# Patient Record
Sex: Female | Born: 1990 | Hispanic: No | Marital: Single | Smoking: Never smoker
Health system: Southern US, Community
[De-identification: ages and names within clinical notes are randomized; demographics above are authoritative.]

---

## 2014-08-26 ENCOUNTER — Ambulatory Visit: Payer: Self-pay

## 2016-07-16 ENCOUNTER — Encounter: Payer: Self-pay | Admitting: Physician Assistant

## 2016-07-16 ENCOUNTER — Ambulatory Visit (INDEPENDENT_AMBULATORY_CARE_PROVIDER_SITE_OTHER): Payer: BLUE CROSS/BLUE SHIELD | Admitting: Physician Assistant

## 2016-07-16 VITALS — BP 102/68 | HR 95 | Temp 98.0°F | Resp 18 | Ht 64.96 in | Wt 135.0 lb

## 2016-07-16 DIAGNOSIS — M461 Sacroiliitis, not elsewhere classified: Secondary | ICD-10-CM

## 2016-07-16 DIAGNOSIS — R0989 Other specified symptoms and signs involving the circulatory and respiratory systems: Secondary | ICD-10-CM

## 2016-07-16 DIAGNOSIS — F458 Other somatoform disorders: Secondary | ICD-10-CM | POA: Diagnosis not present

## 2016-07-16 DIAGNOSIS — R198 Other specified symptoms and signs involving the digestive system and abdomen: Secondary | ICD-10-CM

## 2016-07-16 MED ORDER — CYCLOBENZAPRINE HCL 10 MG PO TABS: 10.0000 mg | ORAL_TABLET | Freq: Three times a day (TID) | ORAL | 0 refills | Status: DC | PRN

## 2016-07-16 MED ORDER — OMEPRAZOLE 20 MG PO CPDR: 20.0000 mg | DELAYED_RELEASE_CAPSULE | Freq: Every day | ORAL | 3 refills | Status: DC

## 2016-07-16 MED ORDER — MELOXICAM 15 MG PO TABS: 15.0000 mg | ORAL_TABLET | Freq: Every day | ORAL | 0 refills | Status: DC

## 2016-07-16 NOTE — Patient Instructions (Addendum)
Ice the back three times per day for 15 minutes Three sets of stretching.  Pick three pictures, and perform them.  After you are done, you will ice the back.   Do not take naproxen or ibuprofen.  You can take tylenol Please be careful with the flexeril.    Back Exercises If you have pain in your back, do these exercises 2-3 times each day or as told by your doctor. When the pain goes away, do the exercises once each day, but repeat the steps more times for each exercise (do more repetitions). If you do not have pain in your back, do these exercises once each day or as told by your doctor. Exercises Single Knee to Chest  Do these steps 3-5 times in a row for each leg: 1. Lie on your back on a firm bed or the floor with your legs stretched out. 2. Bring one knee to your chest. 3. Hold your knee to your chest by grabbing your knee or thigh. 4. Pull on your knee until you feel a gentle stretch in your lower back. 5. Keep doing the stretch for 10-30 seconds. 6. Slowly let go of your leg and straighten it.  Pelvic Tilt  Do these steps 5-10 times in a row: 1. Lie on your back on a firm bed or the floor with your legs stretched out. 2. Bend your knees so they point up to the ceiling. Your feet should be flat on the floor. 3. Tighten your lower belly (abdomen) muscles to press your lower back against the floor. This will make your tailbone point up to the ceiling instead of pointing down to your feet or the floor. 4. Stay in this position for 5-10 seconds while you gently tighten your muscles and breathe evenly.  Cat-Cow  Do these steps until your lower back bends more easily: 1. Get on your hands and knees on a firm surface. Keep your hands under your shoulders, and keep your knees under your hips. You may put padding under your knees. 2. Let your head hang down, and make your tailbone point down to the floor so your lower back is round like the back of a cat. 3. Stay in this position for 5  seconds. 4. Slowly lift your head and make your tailbone point up to the ceiling so your back hangs low (sags) like the back of a cow. 5. Stay in this position for 5 seconds.  Press-Ups  Do these steps 5-10 times in a row: 1. Lie on your belly (face-down) on the floor. 2. Place your hands near your head, about shoulder-width apart. 3. While you keep your back relaxed and keep your hips on the floor, slowly straighten your arms to raise the top half of your body and lift your shoulders. Do not use your back muscles. To make yourself more comfortable, you may change where you place your hands. 4. Stay in this position for 5 seconds. 5. Slowly return to lying flat on the floor.  Bridges  Do these steps 10 times in a row: 1. Lie on your back on a firm surface. 2. Bend your knees so they point up to the ceiling. Your feet should be flat on the floor. 3. Tighten your butt muscles and lift your butt off of the floor until your waist is almost as high as your knees. If you do not feel the muscles working in your butt and the back of your thighs, slide your feet 1-2 inches  farther away from your butt. 4. Stay in this position for 3-5 seconds. 5. Slowly lower your butt to the floor, and let your butt muscles relax.  If this exercise is too easy, try doing it with your arms crossed over your chest. Belly Crunches  Do these steps 5-10 times in a row: 1. Lie on your back on a firm bed or the floor with your legs stretched out. 2. Bend your knees so they point up to the ceiling. Your feet should be flat on the floor. 3. Cross your arms over your chest. 4. Tip your chin a little bit toward your chest but do not bend your neck. 5. Tighten your belly muscles and slowly raise your chest just enough to lift your shoulder blades a tiny bit off of the floor. 6. Slowly lower your chest and your head to the floor.  Back Lifts Do these steps 5-10 times in a row: 1. Lie on your belly (face-down) with your  arms at your sides, and rest your forehead on the floor. 2. Tighten the muscles in your legs and your butt. 3. Slowly lift your chest off of the floor while you keep your hips on the floor. Keep the back of your head in line with the curve in your back. Look at the floor while you do this. 4. Stay in this position for 3-5 seconds. 5. Slowly lower your chest and your face to the floor.  Contact a doctor if:  Your back pain gets a lot worse when you do an exercise.  Your back pain does not lessen 2 hours after you exercise. If you have any of these problems, stop doing the exercises. Do not do them again unless your doctor says it is okay. Get help right away if:  You have sudden, very bad back pain. If this happens, stop doing the exercises. Do not do them again unless your doctor says it is okay. This information is not intended to replace advice given to you by your health care provider. Make sure you discuss any questions you have with your health care provider. Document Released: 03/03/2010 Document Revised: 07/07/2015 Document Reviewed: 03/25/2014 Elsevier Interactive Patient Education  2018 [employer].  Sacroiliac Joint Dysfunction Sacroiliac joint dysfunction is a condition that causes inflammation on one or both sides of the sacroiliac (SI) joint. The SI joint connects the lower part of the spine (sacrum) with the two upper portions of the pelvis (ilium). This condition causes deep aching or burning pain in the low back. In some cases, the pain may also spread into one or both buttocks or hips or spread down the legs.  What are the signs or symptoms? Symptoms of this condition include:  Aching or burning pain in the lower back. The pain may also spread to other areas, such as: ? Buttocks. ? Groin. ? Thighs and legs.  Muscle spasms in or around the painful areas.  Increased pain when standing, walking, running, stair climbing, bending, or lifting.  How is this  diagnosed? Your health care provider will do a physical exam and take your medical history. During the exam, the health care provider may move one or both of your legs to different positions to check for pain. Various tests may be done to help verify the diagnosis, including:  Imaging tests to look for other causes of pain. These may include: ? MRI. ? CT scan. ? Bone scan.  Diagnostic injection. A numbing medicine is injected into the SI joint  using a needle. If the pain is temporarily improved or stopped after the injection, this can indicate that SI joint dysfunction is the problem.  How is this treated? Treatment may vary depending on the cause and severity of your condition. Treatment options may include:  Applying ice or heat to the lower back area. This can help to reduce pain and muscle spasms.  Medicines to relieve pain or inflammation or to relax the muscles.  Wearing a back brace (sacroiliac brace) to help support the joint while your back is healing.  Physical therapy to increase muscle strength around the joint and flexibility at the joint. This may also involve learning proper body positions and ways of moving to relieve stress on the joint.  Direct manipulation of the SI joint.  Injections of steroid medicine into the joint in order to reduce pain and swelling.  Radiofrequency ablation to burn away nerves that are carrying pain messages from the joint.  Use of a device that provides electrical stimulation in order to reduce pain at the joint.  Surgery to put in screws and plates that limit or prevent joint motion. This is rare.  Follow these instructions at home:  Rest as needed. Limit your activities as directed by your health care provider.  Take medicines only as directed by your health care provider.  If directed, apply ice to the affected area: ? Put ice in a plastic bag. ? Place a towel between your skin and the bag. ? Leave the ice on for 20 minutes, 2-3  times per day.  Use a heating pad or a moist heat pack as directed by your health care provider.  Exercise as directed by your health care provider or physical therapist.  Keep all follow-up visits as directed by your health care provider. This is important. Contact a health care provider if:  Your pain is not controlled with medicine.  You have a fever.  You have increasingly severe pain. Get help right away if:  You have weakness, numbness, or tingling in your legs or feet.  You lose control of your bladder or bowel. This information is not intended to replace advice given to you by your health care provider. Make sure you discuss any questions you have with your health care provider. Document Released: 04/27/2008 Document Revised: 07/07/2015 Document Reviewed: 10/06/2013 Elsevier Interactive Patient Education  2018 [employer].   Food Choices for Gastroesophageal Reflux Disease, Adult When you have gastroesophageal reflux disease (GERD), the foods you eat and your eating habits are very important. Choosing the right foods can help ease your discomfort. What guidelines do I need to follow?  Choose fruits, vegetables, whole grains, and low-fat dairy products.  Choose low-fat meat, fish, and poultry.  Limit fats such as oils, salad dressings, butter, nuts, and avocado.  Keep a food diary. This helps you identify foods that cause symptoms.  Avoid foods that cause symptoms. These may be different for everyone.  Eat small meals often instead of 3 large meals a day.  Eat your meals slowly, in a place where you are relaxed.  Limit fried foods.  Cook foods using methods other than frying.  Avoid drinking alcohol.  Avoid drinking large amounts of liquids with your meals.  Avoid bending over or lying down until 2-3 hours after eating. What foods are not recommended? These are some foods and drinks that may make your symptoms worse: Vegetables Tomatoes. Tomato juice.  Tomato and spaghetti sauce. Chili peppers. Onion and garlic. Horseradish. Fruits  Oranges, grapefruit, and lemon (fruit and juice). Meats High-fat meats, fish, and poultry. This includes hot dogs, ribs, ham, sausage, salami, and bacon. Dairy Whole milk and chocolate milk. Sour cream. Cream. Butter. Ice cream. Cream cheese. Drinks Coffee and tea. Bubbly (carbonated) drinks or energy drinks. Condiments Hot sauce. Barbecue sauce. Sweets/Desserts Chocolate and cocoa. Donuts. Peppermint and spearmint. Fats and Oils High-fat foods. This includes [place] fries and potato chips. Other Vinegar. Strong spices. This includes black pepper, white pepper, red pepper, cayenne, curry powder, cloves, ginger, and chili powder. The items listed above may not be a complete list of foods and drinks to avoid. Contact your dietitian for more information. This information is not intended to replace advice given to you by your health care provider. Make sure you discuss any questions you have with your health care provider. Document Released: 07/31/2011 Document Revised: 07/07/2015 Document Reviewed: 12/03/2012 Elsevier Interactive Patient Education  2017 [employer].    IF you received an x-ray today, you will receive an invoice from [facility]. Please contact [facility] at [phone] with questions or concerns regarding your invoice.   IF you received labwork today, you will receive an invoice from [facility]. Please contact [facility] at [phone] with questions or concerns regarding your invoice.   Our billing staff will not be able to assist you with questions regarding bills from these companies.  You will be contacted with the lab results as soon as they are available. The fastest way to get your results is to activate your My Chart account. Instructions are located on the last page of this paperwork. If you have not heard from [place] regarding the results in 2 weeks, please contact this  office.

## 2016-07-16 NOTE — Progress Notes (Signed)
PRIMARY CARE AT [facility] [address], [place] [address] [phone]  Date:  07/16/2016   Name:  Linda May   DOB:  [birth year]   MRN:  [id number]  PCP:  Patient, No Pcp Per    History of Present Illness:  Linda May is a 26 y.o. female patient who presents to PCP with  Chief Complaint  Patient presents with  . Establish Care    pt concerned about lower back pain and some blood in her mucus x 2 months.      Pressure along the right side of leg.  It radiates down her right leg, with numbness.  Right pain makes it feel unstable.  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   No swelling.  She recalls that she may have fallen along that side of her back, down the stairs.   Couple months of blood.  She will see red streaking after she tries to cough it up.  No hx of coughing or sore throat.  No fever.  No heavy bleeding of gums.    There are no active problems to display for this patient.   History reviewed. No pertinent past medical history.  History reviewed. No pertinent surgical history.  Social History  Substance Use Topics  . Smoking status: Never Smoker  . Smokeless tobacco: Never Used  . Alcohol use No    Family History  Problem Relation Age of Onset  . Diabetes Mother   . Cancer Father   . Diabetes Maternal Grandmother   . Cancer Maternal Grandfather     Not on File  Medication list has been reviewed and updated.  No current outpatient prescriptions on file prior to visit.   No current facility-administered medications on file prior to visit.     ROS ROS otherwise unremarkable unless listed above.  Physical Examination: BP 102/68   Pulse 95   Temp 98 F (36.7 C) (Oral)   Resp 18   Ht 5' 4.96" (1.65 m)   Wt 135 lb (61.2 kg)   LMP 07/02/2016 (Approximate)   SpO2 99%   BMI 22.49 kg/m  Ideal Body Weight: Weight in (lb) to have BMI = 25: 149.7  Physical Exam  Constitutional: She is  oriented to person, place, and time. She appears well-developed and well-nourished. No distress.  HENT:  Head: Normocephalic and atraumatic.  Right Ear: External ear normal.  Left Ear: External ear normal.  Eyes: Conjunctivae and EOM are normal. Pupils are equal, round, and reactive to light.  Cardiovascular: Normal rate.   Pulmonary/Chest: Effort normal. No respiratory distress.  Musculoskeletal:       Lumbar back: She exhibits bony tenderness (si joint (right side)).  No swelling Positive straight leg raise test. No tenderness along the piriformis. Nl forward flexion, lateral deviation rom. Normal lower extremity strength.   Normal patellar reflexes.  Neurological: She is alert and oriented to person, place, and time.  Skin: She is not diaphoretic.  Psychiatric: She has a normal mood and affect. Her behavior is normal.     Assessment and Plan: Linda May is a 26 y.o. female who is here today for cc of bleeding gums Advised anti-inflammatory and msk relaxer.  Precautions discussed.  Icing 3 times per day for 15 minutes and stretches discussed and demonstrated. Given omeprazole for globus sensation, along with gerd diet.  Follow up in 2 weeks if symptoms do improve of the  back pain or globus sensation. Sacroiliitis (HCC) - Plan: meloxicam (MOBIC) 15 MG tablet, cyclobenzaprine (FLEXERIL) 10 MG tablet  Globus sensation - Plan: omeprazole (PRILOSEC) 20 MG capsule  Linda PlattStephanie Jodel Mayhall, PA-C [facility] 6/6/20187:15 PM 25 minutes of direct care given at this visit.

## 2016-09-19 ENCOUNTER — Encounter: Payer: Self-pay | Admitting: Family Medicine

## 2016-09-19 ENCOUNTER — Ambulatory Visit (INDEPENDENT_AMBULATORY_CARE_PROVIDER_SITE_OTHER): Payer: BLUE CROSS/BLUE SHIELD | Admitting: Family Medicine

## 2016-09-19 VITALS — BP 117/74 | HR 78 | Temp 99.0°F | Resp 17 | Ht 65.5 in | Wt 135.0 lb

## 2016-09-19 DIAGNOSIS — M545 Low back pain, unspecified: Secondary | ICD-10-CM

## 2016-09-19 DIAGNOSIS — M533 Sacrococcygeal disorders, not elsewhere classified: Secondary | ICD-10-CM | POA: Diagnosis not present

## 2016-09-19 DIAGNOSIS — M461 Sacroiliitis, not elsewhere classified: Secondary | ICD-10-CM

## 2016-09-19 DIAGNOSIS — S76011A Strain of muscle, fascia and tendon of right hip, initial encounter: Secondary | ICD-10-CM | POA: Diagnosis not present

## 2016-09-19 DIAGNOSIS — G8929 Other chronic pain: Secondary | ICD-10-CM | POA: Diagnosis not present

## 2016-09-19 LAB — POC MICROSCOPIC URINALYSIS (UMFC): Mucus: ABSENT

## 2016-09-19 LAB — POCT URINALYSIS DIP (MANUAL ENTRY)
Bilirubin, UA: NEGATIVE
Glucose, UA: NEGATIVE mg/dL
Ketones, POC UA: NEGATIVE mg/dL
Leukocytes, UA: NEGATIVE
Nitrite, UA: NEGATIVE
Protein Ur, POC: NEGATIVE mg/dL
Spec Grav, UA: 1.015
Urobilinogen, UA: 0.2 U/dL
pH, UA: 6

## 2016-09-19 MED ORDER — DICLOFENAC SODIUM 75 MG PO TBEC: 75.0000 mg | DELAYED_RELEASE_TABLET | Freq: Two times a day (BID) | ORAL | 1 refills | Status: AC

## 2016-09-19 MED ORDER — POLYETHYLENE GLYCOL 3350 17 GM/SCOOP PO POWD: 17.0000 g | Freq: Every day | ORAL | 11 refills | Status: AC

## 2016-09-19 NOTE — Patient Instructions (Signed)
     IF you received an x-ray today, you will receive an invoice from [facility]. Please contact [facility] at [phone] with questions or concerns regarding your invoice.   IF you received labwork today, you will receive an invoice from [facility]. Please contact [facility] at [phone] with questions or concerns regarding your invoice.   Our billing staff will not be able to assist you with questions regarding bills from these companies.  You will be contacted with the lab results as soon as they are available. The fastest way to get your results is to activate your My Chart account. Instructions are located on the last page of this paperwork. If you have not heard from us regarding the results in 2 weeks, please contact this office.     

## 2016-09-19 NOTE — Progress Notes (Addendum)
Subjective:  By signing my name below, I, Linda May, attest that this documentation has been prepared under the direction and in the presence of Clorene Nerio, MD Electronically Signed: Charline BillsEssence May, ED Scribe 09/19/2016 at 1:51 PM.   Patient ID: Linda ClienNorberto SorensontHiba Ress Mohamed Taha May, female    DOB: [birth year], 26 y.o.   MRN: [id number]  Chief Complaint  Patient presents with  . Back Pain   HPI Linda May is a 26 y.o. female who [facility] complaining of  Pt presents with gradually worsening right low back pain in the upper sacrum/midline area. States back pain seemed to be improving until she slipped in the bathroom yesterday after showering and noticed shaking in her legs afterwards. She reports some gradually improving weakness in LE, R worse than L, worse with standing. Pt also reports some mild abdominal pain and constipation which she attributes to Mobic and Flexeril. She denies numbness/tingling in her LE, diarrhea, bladder/bowel incontinence, dysuria, vaginal discharge, vaginal itching, malodourous urine, heartburn, indigestion. Pt denies chance of pregnancy; LNMP ended 2 days ago.   No past medical history on file. Current Outpatient Prescriptions on File Prior to Visit  Medication Sig Dispense Refill  . cyclobenzaprine (FLEXERIL) 10 MG tablet Take 1 tablet (10 mg total) by mouth 3 (three) times daily as needed for muscle spasms. (Patient not taking: Reported on 09/19/2016) 30 tablet 0  . meloxicam (MOBIC) 15 MG tablet Take 1 tablet (15 mg total) by mouth daily. (Patient not taking: Reported on 09/19/2016) 30 tablet 0  . omeprazole (PRILOSEC) 20 MG capsule Take 1 capsule (20 mg total) by mouth daily. (Patient not taking: Reported on 09/19/2016) 30 capsule 3   No current facility-administered medications on file prior to visit.    Not on File   No past surgical history on file. Family History  Problem Relation Age of Onset  . Diabetes Mother   .  Cancer Father   . Diabetes Maternal Grandmother   . Cancer Maternal Grandfather    Social History   Social History  . Marital status: Single    Spouse name: N/A  . Number of children: N/A  . Years of education: N/A   Social History Main Topics  . Smoking status: Never Smoker  . Smokeless tobacco: Never Used  . Alcohol use No  . Drug use: No  . Sexual activity: No   Other Topics Concern  . None   Social History Narrative  . None   [facility] 09/19/2016 07/16/2016  Decreased Interest 0 0  Down, Depressed, Hopeless 0 0  PHQ - 2 Score 0 0    Review of Systems  Musculoskeletal: Positive for back pain.      Objective:   Physical Exam  Constitutional: She is oriented to person, place, and time. She appears well-developed and well-nourished. No distress.  HENT:  Head: Normocephalic and atraumatic.  Right Ear: External ear normal.  Eyes: Conjunctivae are normal. No scleral icterus.  Pulmonary/Chest: Effort normal.  Neurological: She is alert and oriented to person, place, and time.  Skin: Skin is warm and dry. She is not diaphoretic. No erythema.  Psychiatric: She has a normal mood and affect. Her behavior is normal.    Blood pressure 117/74, pulse 78, temperature 99 F (37.2 C), temperature source Oral, resp. rate 17, height 5' 5.5" (1.664 m), weight 135 lb (61.2 kg), last menstrual period 09/18/2016, SpO2 98 %. 3+ patellar and achilles DTRs.  5/5 LE strength bilateral except R hip flexor which is 4+.  Negative straight leg raise bilaterally. Tenderness to palpation over the R low lumbar sacral paraspinal muscles. Severe pain over the R SI joint. Mild pain over bilateral trochanter. Pain with R internal hip rotation but hip ROM normal.  Normal bowel sounds. Mild tenderness to palpation over the R adnexa.      Results for orders placed or performed in visit on 09/19/16  POCT urinalysis dipstick  Result Value Ref Range   Color, UA yellow yellow   Clarity,  UA clear clear   Glucose, UA negative negative mg/dL   Bilirubin, UA negative negative   Ketones, POC UA negative negative mg/dL   Spec Grav, UA 1.610 9.604 - 1.025   Blood, UA moderate (A) negative   pH, UA 6.0 5.0 - 8.0   Protein Ur, POC negative negative mg/dL   Urobilinogen, UA 0.2 0.2 or 1.0 E.U./dL   Nitrite, UA Negative Negative   Leukocytes, UA Negative Negative  POCT Microscopic Urinalysis ([facility])  Result Value Ref Range   WBC,UR,HPF,POC None None WBC/hpf   RBC,UR,HPF,POC Few (A) None RBC/hpf   Bacteria Few (A) None, Too numerous to count   Mucus Absent Absent   Epithelial Cells, UR Per Microscopy Few (A) None, Too numerous to count cells/hpf    Assessment & Plan:   1. Sacroiliitis (HCC)   2. Acute bilateral low back pain without sciatica   3. Chronic right SI joint pain   4. Strain of flexor muscle of right hip, initial encounter    Our xray machine is currently not working - pt agrees to [employer] for xray only visit. Handout given for home exercises for SI joint dysfunction.  Treated w/ mobic and flexeril prior pt but only tried for a few d as didn't notice improvement and thought caused constipation (which would certainly worsen her sxs so start miralax this time with nsaid) so still needs to do 4-6 wks trial of nsaid. If not starting to improve within 1 -2 wks or no resolution in 4-6 wks call for PT referral or RTC for further eval. May need xray imaging of sacrum/pelvis.  Orders Placed This Encounter  Procedures  . DG Si Joints    Standing Status:   Future    Standing Expiration Date:   09/19/2017    Order Specific Question:   Reason for Exam (SYMPTOM  OR DIAGNOSIS REQUIRED)    Answer:   severely worsening Rt>Lt SI joint pain wiht right hip flexor weakness    Order Specific Question:   Is the patient pregnant?    Answer:   No    Order Specific Question:   Preferred imaging location?    Answer:   External  . POCT urinalysis dipstick  . POCT Microscopic  Urinalysis ([facility])    Meds ordered this encounter  Medications  . diclofenac (VOLTAREN) 75 MG EC tablet    Sig: Take 1 tablet (75 mg total) by mouth 2 (two) times daily.    Dispense:  60 tablet    Refill:  1  . polyethylene glycol powder (GLYCOLAX/MIRALAX) powder    Sig: Take 17 g by mouth daily.    Dispense:  500 g    Refill:  11    I personally performed the services described in this documentation, which was scribed in my presence. The recorded information has been reviewed and considered, and addended by me as needed.   Norberto Sorenson, M.D.  Primary Care at  [facility] [address] [place], [place] [address] [phone] phone [phone] fax  09/19/16 7:36 PM

## 2016-09-20 ENCOUNTER — Ambulatory Visit (INDEPENDENT_AMBULATORY_CARE_PROVIDER_SITE_OTHER): Payer: BLUE CROSS/BLUE SHIELD | Admitting: Physician Assistant

## 2016-09-20 ENCOUNTER — Ambulatory Visit (INDEPENDENT_AMBULATORY_CARE_PROVIDER_SITE_OTHER): Payer: BLUE CROSS/BLUE SHIELD

## 2016-09-20 DIAGNOSIS — M461 Sacroiliitis, not elsewhere classified: Secondary | ICD-10-CM | POA: Diagnosis not present

## 2016-09-21 ENCOUNTER — Telehealth: Payer: Self-pay | Admitting: Family Medicine

## 2016-09-21 NOTE — Telephone Encounter (Signed)
Pt is calling to know her x-ray results.  Please advise  [phone]

## 2016-09-22 NOTE — Progress Notes (Signed)
Lab only visit. Deliah BostonMichael Theresia Pree, MS, PA-C 11:13 AM, 09/22/2016

## 2016-09-28 NOTE — Telephone Encounter (Signed)
Multiple lab calls made with no answer. Unable to reach letter sent.

## 2017-05-15 ENCOUNTER — Encounter: Payer: Self-pay | Admitting: Physician Assistant

## 2018-08-02 IMAGING — DX DG SI JOINTS 3+V
2 series · 2 of 2 positions shown · non-contrast
Comparison: None.

CLINICAL DATA: Low back pain, evaluate for sacroiliitis

EXAM:
BILATERAL SACROILIAC JOINTS - 3+ VIEW

[si joint (1 of 2)]
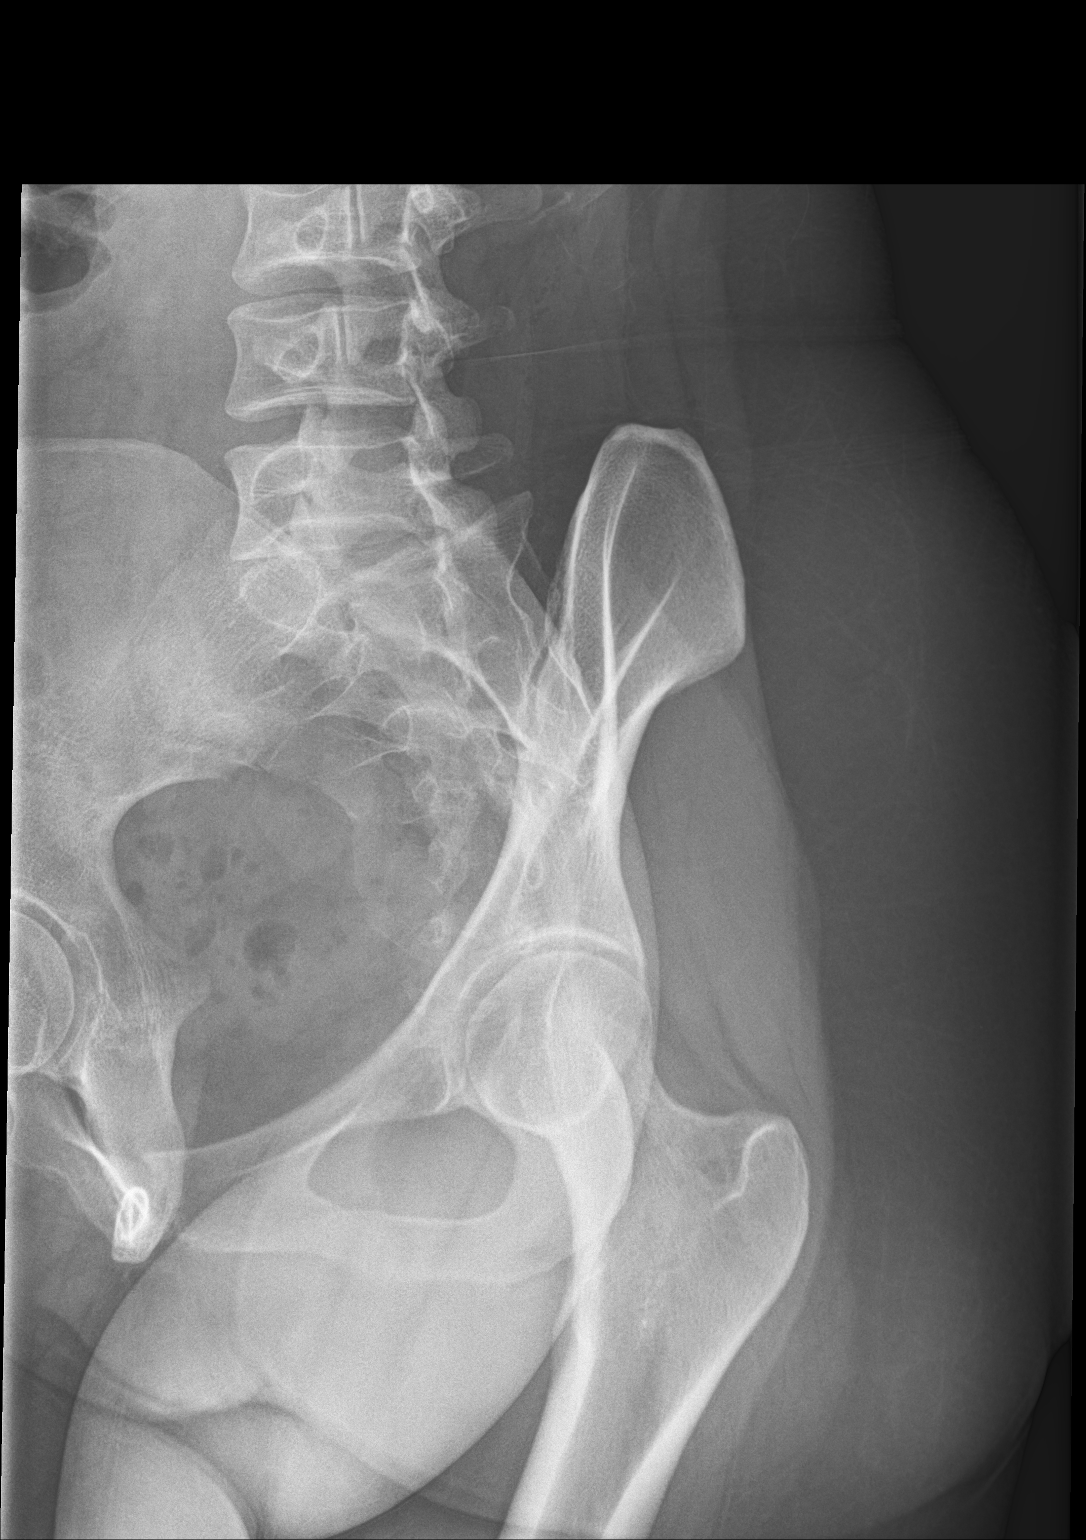

[si joint (2 of 2)]
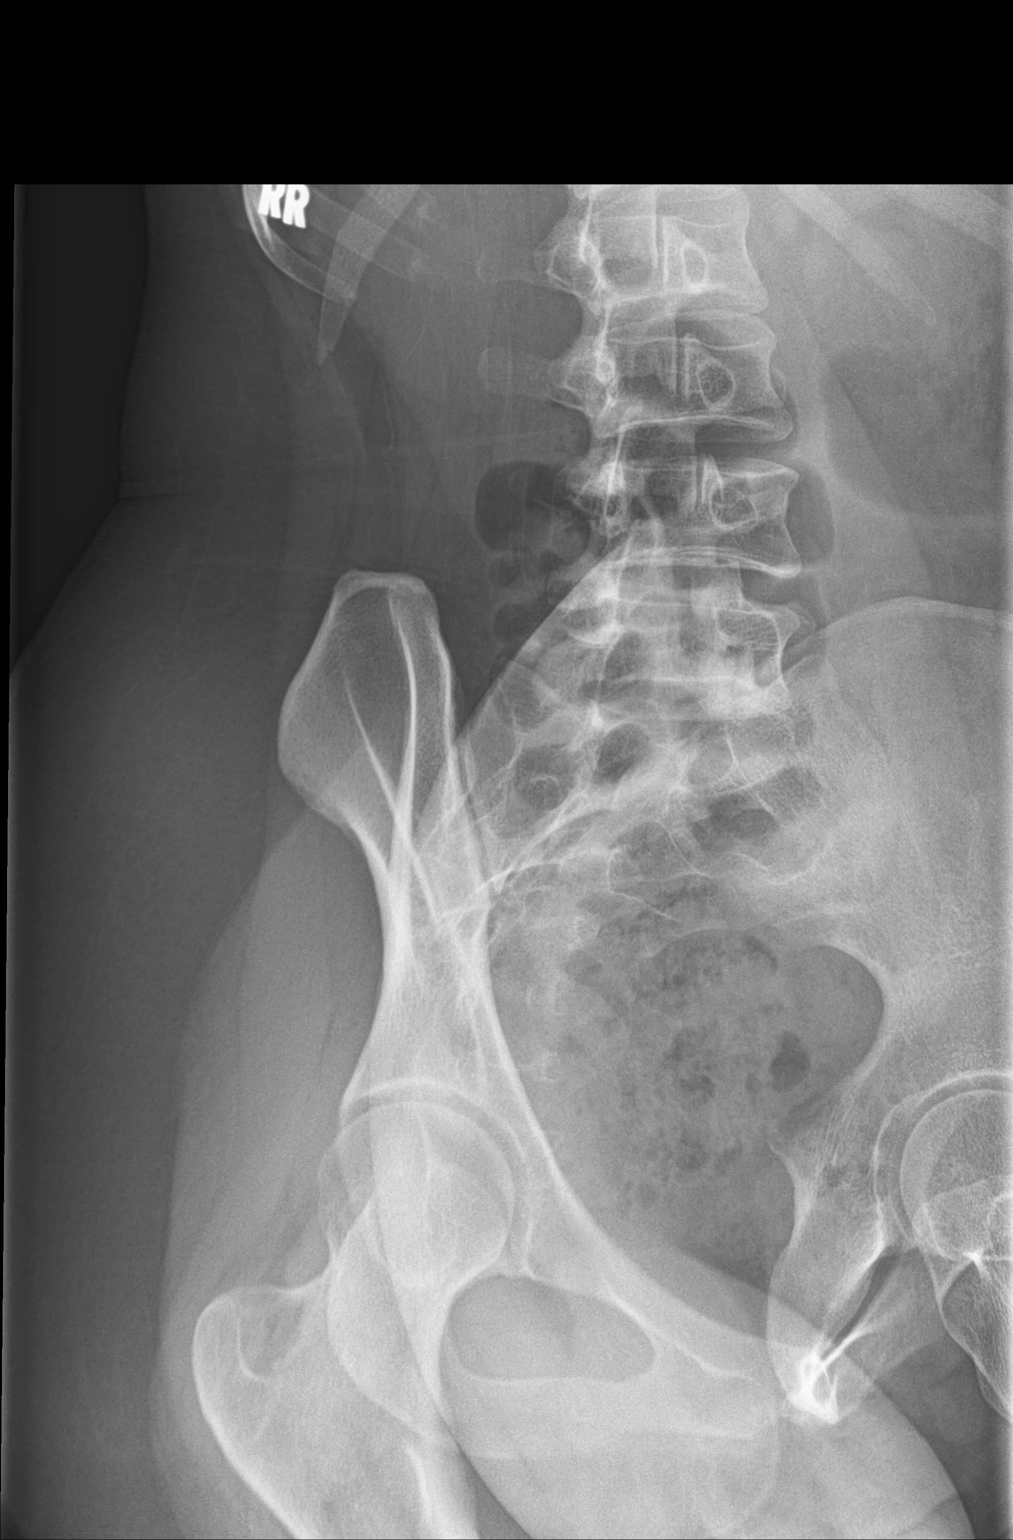

[2 of 2 positions shown; findings below may reference images not displayed]

FINDINGS: On the views obtained, no evidence of sacroiliitis is seen, with the
SI joints appearing well corticated.
IMPRESSION: No evidence of sacroiliitis.

## 2019-06-08 ENCOUNTER — Other Ambulatory Visit: Payer: Self-pay

## 2019-06-10 ENCOUNTER — Ambulatory Visit: Payer: Self-pay | Attending: Internal Medicine

## 2019-06-10 DIAGNOSIS — Z20822 Contact with and (suspected) exposure to covid-19: Secondary | ICD-10-CM | POA: Insufficient documentation

## 2019-06-11 LAB — SARS-COV-2, NAA 2 DAY TAT

## 2019-06-11 LAB — NOVEL CORONAVIRUS, NAA: SARS-CoV-2, NAA: NOT DETECTED
